# Patient Record
Sex: Female | Born: 1985 | Hispanic: Yes | Marital: Married | Smoking: Never smoker
Health system: Southern US, Community
[De-identification: ages and names within clinical notes are randomized; demographics above are authoritative.]

---

## 2014-12-07 ENCOUNTER — Emergency Department: Payer: Self-pay

## 2014-12-07 ENCOUNTER — Encounter: Payer: Self-pay | Admitting: Emergency Medicine

## 2014-12-07 ENCOUNTER — Emergency Department
Admission: EM | Admit: 2014-12-07 | Discharge: 2014-12-07 | Disposition: A | Discharge status: Home / Self Care | Payer: Self-pay | Attending: Emergency Medicine | Admitting: Emergency Medicine

## 2014-12-07 DIAGNOSIS — S53104A Unspecified dislocation of right ulnohumeral joint, initial encounter: Secondary | ICD-10-CM

## 2014-12-07 DIAGNOSIS — Y92000 Kitchen of unspecified non-institutional (private) residence as  the place of occurrence of the external cause: Secondary | ICD-10-CM | POA: Insufficient documentation

## 2014-12-07 DIAGNOSIS — Z9889 Other specified postprocedural states: Secondary | ICD-10-CM

## 2014-12-07 DIAGNOSIS — Y998 Other external cause status: Secondary | ICD-10-CM | POA: Insufficient documentation

## 2014-12-07 DIAGNOSIS — Z720 Tobacco use: Secondary | ICD-10-CM | POA: Insufficient documentation

## 2014-12-07 DIAGNOSIS — S53024A Posterior dislocation of right radial head, initial encounter: Secondary | ICD-10-CM | POA: Insufficient documentation

## 2014-12-07 DIAGNOSIS — W010XXA Fall on same level from slipping, tripping and stumbling without subsequent striking against object, initial encounter: Secondary | ICD-10-CM | POA: Insufficient documentation

## 2014-12-07 DIAGNOSIS — Q899 Congenital malformation, unspecified: Secondary | ICD-10-CM

## 2014-12-07 DIAGNOSIS — Y9389 Activity, other specified: Secondary | ICD-10-CM | POA: Insufficient documentation

## 2014-12-07 MED ORDER — HYDROMORPHONE HCL 1 MG/ML IJ SOLN
1.0000 mg | Freq: Once | INTRAMUSCULAR | Status: AC
  Administered 2014-12-07: 1 mg via INTRAVENOUS
  Filled 2014-12-07: qty 1

## 2014-12-07 MED ORDER — OXYCODONE-ACETAMINOPHEN 5-325 MG PO TABS: 1.0000 | ORAL_TABLET | Freq: Four times a day (QID) | ORAL | Status: AC | PRN

## 2014-12-07 NOTE — ED Provider Notes (Addendum)
[facility] Emergency Department Provider Note  ____________________________________________   I have reviewed the triage vital signs and the nursing notes.   HISTORY  Chief Complaint Joint Swelling    HPI Alexandria Green is a 29 y.o. female who is baseline healthy states she tripped on a wet floor in her kitchen and landed on her outstretched right arm. She felt immediate pain to her elbow. No pain anywhere else. No numbness or tingling. No closed head injury no back pain or back injury no neck injury, she denies headache or closed head injury or loss of consciousness. This is a nonstick fall. She has no hip pain. She's been ambulatory. Pain is isolated to that right elbow.  No past medical history on file.  There are no active problems to display for this patient.   No past surgical history on file.  No current outpatient prescriptions on file.  Allergies Review of patient's allergies indicates no known allergies.  No family history on file.  Social History Social History  Substance Use Topics  . Smoking status: Current Some Day Smoker    Types: Cigarettes  . Smokeless tobacco: Not on file  . Alcohol Use: No    Review of Systems Constitutional: No fever/chills Eyes: No visual changes. ENT: No sore throat. No stiff neck no neck pain Cardiovascular: Denies chest pain. Respiratory: Denies shortness of breath. Gastrointestinal:   no vomiting.  No diarrhea.  No constipation. Genitourinary: Negative for dysuria. Musculoskeletal: Negative lower extremity swelling Skin: Negative for rash. Neurological: Negative for headaches, focal weakness or numbness. 10-point ROS otherwise negative.  ____________________________________________   PHYSICAL EXAM:  VITAL SIGNS: ED Triage Vitals  Enc Vitals Group     BP 12/07/14 1729 130/84 mmHg     Pulse Rate 12/07/14 1729 82     Resp 12/07/14 1729 16     Temp 12/07/14 1729 97.5 F (36.4 C)      Temp Source 12/07/14 1729 Oral     SpO2 12/07/14 1729 98 %     Weight 12/07/14 1729 187 lb (84.823 kg)     Height 12/07/14 1729  (1.575 m)     Head Cir --      Peak Flow --      Pain Score 12/07/14 1730 9     Pain Loc --      Pain Edu? --      Excl. in GC? --     Constitutional: Alert and oriented. Well appearing and in no acute distress. Eyes: Conjunctivae are normal. PERRL. EOMI. Head: Atraumatic. Nose: No congestion/rhinnorhea. Mouth/Throat: Mucous membranes are moist.  Oropharynx non-erythematous. Neck: No stridor.   Nontender with no meningismus Cardiovascular: Normal rate, regular rhythm. Grossly normal heart sounds.  Good peripheral circulation. Respiratory: Normal respiratory effort.  No retractions. Lungs CTAB. Abdominal: Soft and nontender. No distention. No guarding no rebound Back:  There is no focal tenderness or step off there is no midline tenderness there are no lesions noted. there is no CVA tenderness Musculoskeletal: No lower extremity tenderness. No joint effusions, no DVT signs strong distal pulses no edema Right upper extremity: No elbow pain no wrist pain or tenderness, there is obvious deformity to the right elbow, with limited range of motion, strong distal pulses Arms or soft sensation is intact Strength is equal although somewhat limited by pain.  Neurologic:  Normal speech and language. No gross focal neurologic deficits are appreciated.  Skin:  Skin is warm, dry and intact. No rash noted.  Psychiatric: Mood and affect are normal. Speech and behavior are normal.  ____________________________________________   LABS (all labs ordered are listed, but only abnormal results are displayed)  Labs Reviewed - No data to display ____________________________________________  EKG  I personally interpreted any EKGs ordered by me or triage  ____________________________________________  RADIOLOGY  I reviewed any imaging ordered by me or triage that were  performed during my shift ____________________________________________   PROCEDURES  Procedure(s) performed: Elbow dislocation reduction, closed, patient did give verbal consent for the procedure. After menstruation of IV analgesia, patient was placed in an upright position, with the assistance of a assistant who applied pressure to the olecranon with his thumbs, I applied traction to the arm itself and turned slightly supine, the elbow relocated itself at that point. I was unable to range the elbow with no evidence of dislocation. She still had comfort there but it was diminished by the procedure. She tolerated it well with no complications. At the end of the procedure she was neurovascular intact with strong pulses and no obvious radial ulnar or medial nerve issues. Compartments remain soft and patient was pleased with the result.  Splint placement: I personally supervised the placement neurovascular intact afterwards  Critical Care performed: None  ____________________________________________   INITIAL IMPRESSION / ASSESSMENT AND PLAN / ED COURSE  Pertinent labs & imaging results that were available during my care of the patient were reviewed by me and considered in my medical decision making (see chart for details).  Patient with a posterior shoulder dislocation. I did offer her moderate sedation but she refused preferring to have pain medication and seeing if I could do it while she was awake. This is not unreasonable and certainly her choice. However, the alternative was offered to her. This being said, we did give her a milligram of IV Dilaudid which she tolerated well, and then we were able to easily reduce her arm with no couple occasions. See note above. Awaiting postreduction films. I placed her in a posterior splint.  ----------------------------------------- 7:45 PM on 12/07/2014 -----------------------------------------  Recent pain is very well controlled now that her elbow  is reduced. Stressed with on-call orthopedic surgery Dr. Rosita KeaMenz who agrees with management and will follow-up with patient in a few days. Neurovascular intact at discharge.    ____________________________________________   FINAL CLINICAL IMPRESSION(S) / ED DIAGNOSES  Final diagnoses:  Deformity  Status post reduction mammoplasty     Jeanmarie PlantJames A McShane, MD 12/07/14 1908  Jeanmarie PlantJames A McShane, MD 12/07/14 1946

## 2014-12-07 NOTE — Discharge Instructions (Signed)
Luxacin del codo  (Elbow Dislocation)  La luxacin del tobillo es el desplazamiento de los huesos que forman la articulacin del codo. El codo est formado por la unin de tres [place]. El hmero es el hueso de la parte superior del brazo. El radio y [place] cbito son los 2 huesos del antebrazo, que forman la parte inferior del codo. Los [place] del codo se Psychologist, occupational con un tejido fibroso y Health and safety inspector (ligamento) que conecta los [place] s. CAUSAS  Las luxaciones del codo no son frecuentes. Generalmente se producen cuando una persona cae hacia adelante con las manos y los codos extendidos. La fuerza del impacto se transmite al codo. A veces hay un movimiento de torsin. Tambin ocurren [employer] Partners accidentes automovilsticos, cuando los pasajeros se abrazan durante el impacto.  FACTORES DE RIESGO  Aunque la luxacin del codo puede ocurrirle a cualquier persona, algunas tienen ms riesgo que [place]. Las personas que tienen ms riesgo de sufrir una luxacin son:   Las personas que nacen con ms flojedad en los ligamentos.  Los que nacen con un cbito que tiene una muesca poco profunda en la bisagra de la articulacin. SNTOMAS  Los sntomas de una luxacin completa son evidentes. Incluyen dolor intenso y la deformidad del brazo.  Los sntomas de una luxacin parcial no son tan evidentes. El codo tiene algo de [place], Biomedical engineer puede doler y Theme park manager hinchado. Tambin, es probable que haya hematomas en la zona interna y externa del codo, en el lugar en que los ligamentos se han estirado o roto.  DIAGNSTICO  Para diagnosticar la luxacin, el mdico le har un examen fsico. Durante el examen, el mdico verificar si siente dolor, el brazo est hinchado y ver si hay deformidad. Tambin se controlarn la piel y la circulacin del brazo. Le tomar el pulso en la [place]. Si durante la luxacin se ha daado la arteria, la mano estar fra al tacto y podr estar de color blanco o prpura. El mdico tambin  controlar la capacidad de mover la [place] y los dedos, para ver si hubo lesiones en los nervios.  Le indicar una radiografa para determinar si hubo lesin en el hueso. Los resultados de la radiografa ayudarn a Scientist, clinical (histocompatibility and immunogenetics) la direccin de la luxacin.  Si tiene una luxacin simple, no habr lesiones importantes en el hueso. Si tiene una luxacin compleja, podr tener huesos rotos (fracturas) asociadas a las [employer].  TRATAMIENTO  Si se trata de una luxacin simple del codo, se realinearn los huesos en un procedimiento denominado reduccin. En [employer], los huesos se vuelven a su posicin original, aplicando un medicamento que adormecer la zona (anestesia regional) o le administrarn un medicamento que lo har dormir ( anestesia general ). Luego se inmoviliza el codo con un cabestrillo o tablilla durante 2 a 3 semanas. Luego debe continuar el tratamiento con fisioterapia para volver a Paramedic.  La luxacin compleja puede requerir ciruga para reestablecer la alineacin de la articulacin y Therapist, sports los ligamentos. Luego de la [place], el codo se proteger con un dispositivo llamado fijador externo. Este dispositivo evita que el codo se luxe nuevamente cuando se realizan los ejercicios de [place]. Podr ser Lois Huxley ciruga adicional para reparar las lesiones en los vasos sanguneos, los nervios, los huesos y ligamentos o para Paramedic la presin excesiva que provoca la hinchazn alrededor de los msculos.  INSTRUCCIONES PARA EL CUIDADO EN EL HOGAR  Las medidas siguientes ayudan a Careers information officer  dolor y facilitan el proceso de curacin.   Haga descansar la articulacin lesionada. No la mueva. Evite las actividades similares a la que le produjo la lesin.  Ejercite la mano y los dedos en la forma que le ha indicado el mdico.  Aplique hielo en la articulacin lesionada durante 1  2 das despus de la reduccin, o segn lo que le indique su mdico.  La aplicacin del hielo ayuda a reducir la inflamacin y Chief Technology Officer.  Ponga el hielo en una bolsa plstica.  Colquese una toalla entre la piel y la bolsa de hielo.  Deje el hielo durante 15 a 20 minutos, cada un par de horas mientras se encuentre despierto.  Eleve el brazo por arriba del nivel del corazn y [place] la [place] y los dedos segn las indicaciones del mdico para evitar la hinchazn.  Tome slo medicamentos de venta libre o prescriptos para Primary school teacher, segn las indicaciones del mdico. SOLICITE ATENCIN MDICA DE INMEDIATO SI:   El cabestrillo se daa.  Tiene un fijador externo y ste se afloja o no se mueve.  Tiene un fijador externo y nota que un lquido supura alrededor de los [place].  El Metallurgist de Scientist, clinical (histocompatibility and immunogenetics).  Pierde la sensibilidad en la mano o en los dedos. ASEGRESE DE QUE:   Comprende estas instrucciones.  Controlar su enfermedad.  Solicitar ayuda de inmediato si no mejora o si empeora.   Esta informacin no tiene Theme park manager el consejo del mdico. Asegrese de hacerle al mdico cualquier pregunta que tenga.   Document Released: 10/21/2004 Document Revised: 04/05/2011 Elsevier Interactive Patient Education 2016 [employer].  Cuidados del yeso o la frula (Cast or Splint Care) El yeso y las frulas sostienen los miembros lesionados y evitan que los huesos se muevan hasta que se curen.  CUIDADOS EN EL HOGAR  Mantenga el yeso o la frula al descubierto durante el tiempo de secado.  El yeso tarda entre 14 y 48 horas en secarse.  La fibra de vidrio se seca en menos de 1 hora.  No apoye el yeso sobre nada que sea ms duro que una almohada durante 24 horas.  No soporte ningn peso sobre el [employer]. No haga presin sobre el yeso. Espere a que el mdico lo autorice.  Mantenga el yeso o la frula secos.  Cbralos con una bolsa plstica cuando se d un bao o los [address]  [address] de [place].  Si tiene Corporate treasurer trax y  la cintura (el tronco) bese con una esponja hasta que se lo quiten.  Si el yeso se moja, squelo con una toalla o un secador de cabello. Utilice el aire fro del secador.  Mantenga el yeso o la frula limpios. Limpie el yeso sucio con un pao hmedo.  Noponga objetos extraos debajo del yeso o de la frula.  No se rasque la piel por debajo del molde con ningn objeto. Si siente picazn, use un secador de cabello con aire fro sobre la zona que pica.  No recorte ni perfore el yeso.  No retire el relleno acolchado que se encuentra debajo del yeso.  Ejercite como le ha indicado el mdico las articulaciones que se encuentran cerca del yeso.  Eleve (levante) el miembro lesionado sobre 1  2 almohadas durante los primeros 1 a 3 das. SOLICITE AYUDA SI:  El yeso o la frula se quiebran.  Siente que el yeso o la frula estn muy apretados o muy flojos.  Siente Neomia Dear  picazn intensa por debajo del yeso.  El yeso se moja o tiene una zona blanda.  Siente un feo olor que proviene del interior del [place].  AlgThrivent Financialn objeto se queda atascado bajo el yeso.  La piel que rodea el yeso enrojece o se vuelve sensible.  Le aparece dolor, o siente ms dolor luego de la colocacin del yeso. SOLICITE AYUDA DE INMEDIATO SI:  Observa un lquido que sale por el yeso.  No puede mover los dedos.  Los dedos estn de color azul o blanco, estn fros, le duelen y estn inflamados (hinchados).  Siente hormigueo o pierde la sensibilidad (adormecimiento) alrededor de la zona de la lesin.  Aumenta el dolor o la presin debajo del yeso.  Tiene problemas para respirar o Company secretaryle falta el aire.  Siente dolor en el pecho.   Esta informacin no tiene Theme park managercomo fin reemplazar el consejo del mdico. Asegrese de hacerle al mdico cualquier pregunta que tenga.   Document Released: 06/15/2010 Document Revised: 09/13/2012 Elsevier Interactive Patient Education [employer]2016 Elsevier Inc.

## 2014-12-07 NOTE — ED Notes (Signed)
Fell backs, caught herself with her rt elbow and is now having pain with deformity

## 2016-09-07 IMAGING — CR DG ELBOW 2V*R*
1 series · 2 of 2 positions shown · non-contrast
Comparison: Right elbow radiographs performed earlier today at [DATE]
p.m.

CLINICAL DATA: Status post reduction of right elbow dislocation.
Initial encounter.

EXAM:
RIGHT ELBOW - 2 VIEW

[Series 1: ap · 0.17mm/px · 2 of 2 slices shown]
[im 1/2]
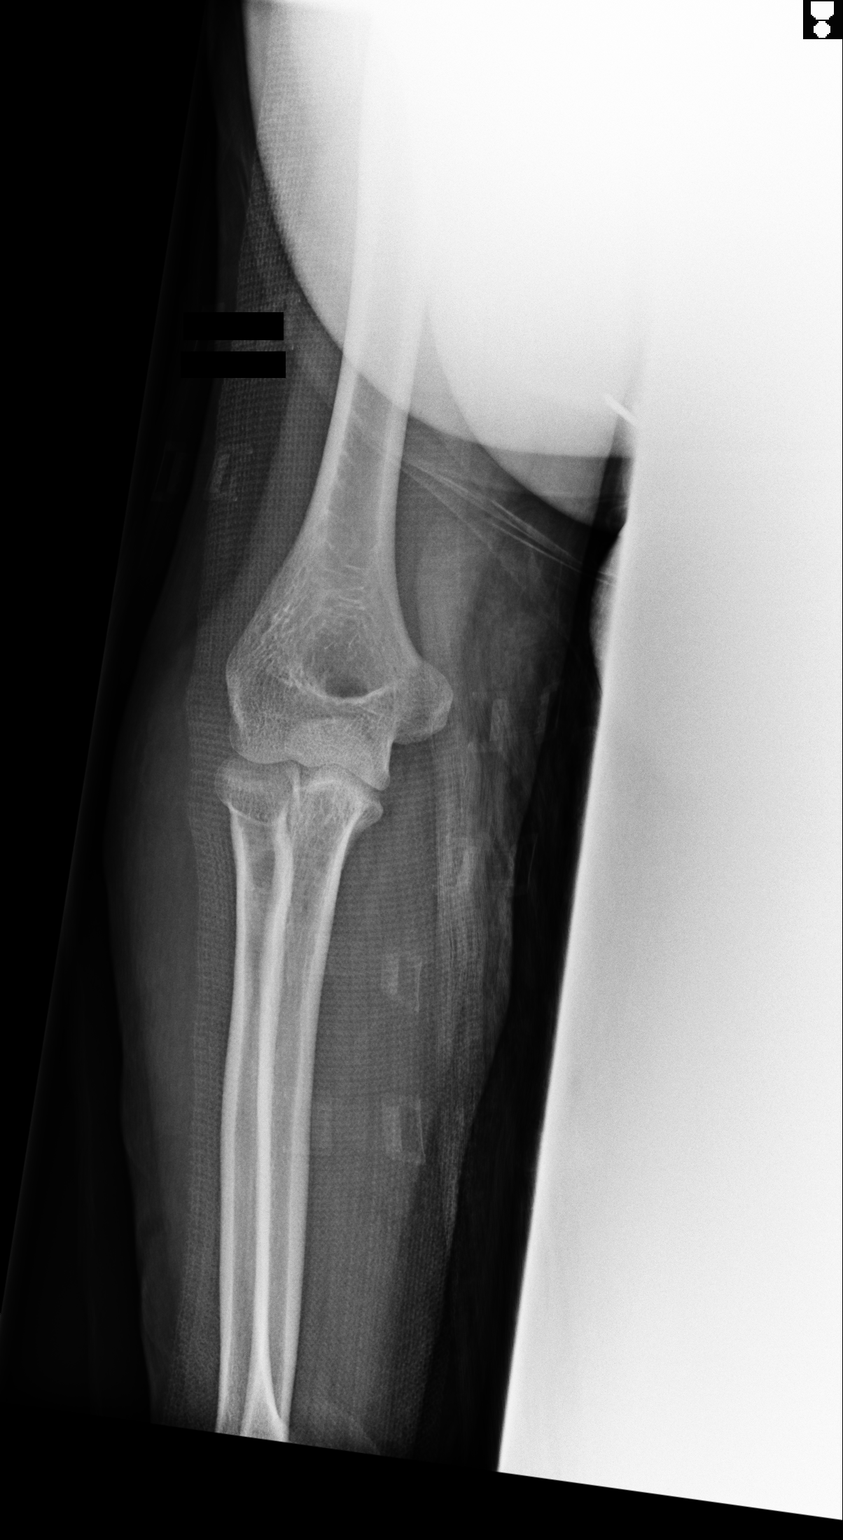
[im 2/2]
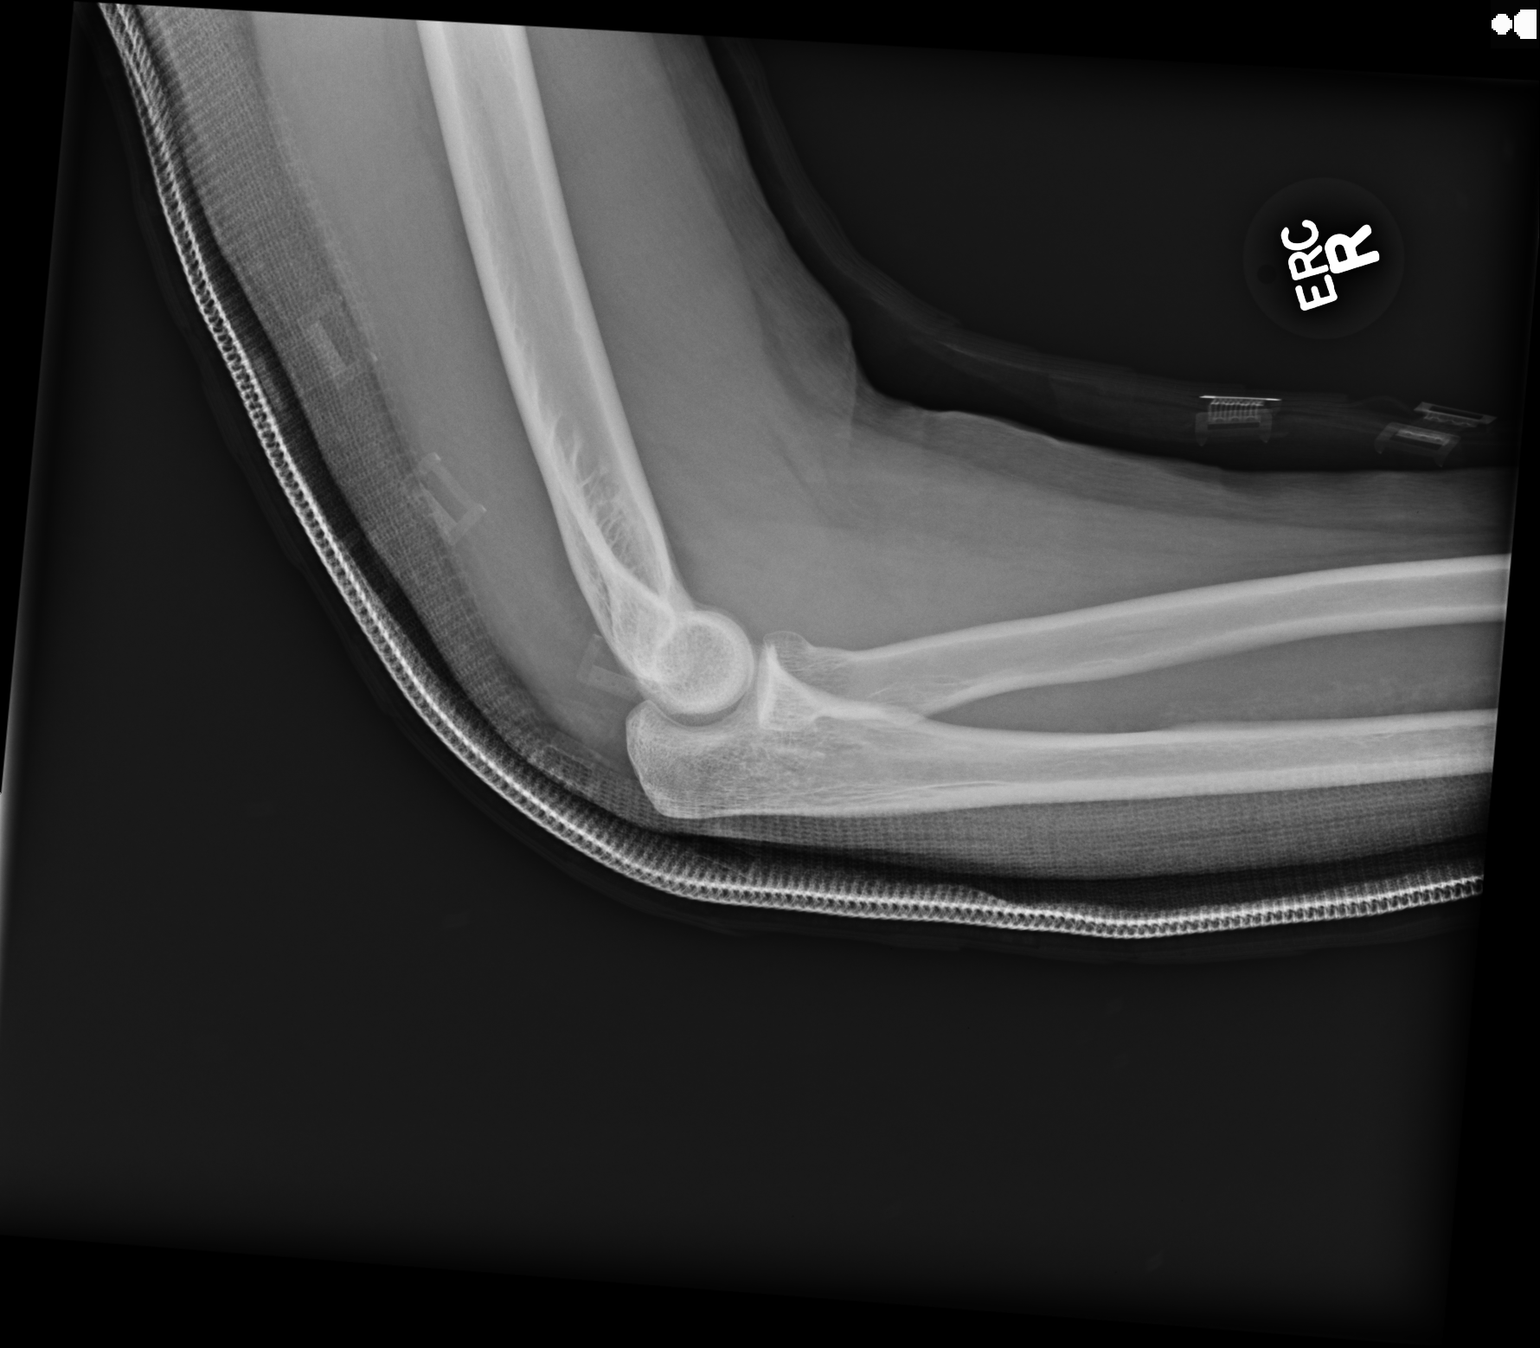

[2 of 2 positions shown; findings below may reference images not displayed]

FINDINGS: There has been successful reduction of the right elbow dislocation.
No definite fracture is seen. An associated elbow joint effusion is
noted. The soft tissues are otherwise grossly unremarkable.
IMPRESSION: Successful reduction of right elbow dislocation. No definite
fracture seen. Elbow joint effusion noted.

## 2020-11-05 ENCOUNTER — Encounter: Payer: Self-pay | Admitting: Physician Assistant

## 2020-11-05 ENCOUNTER — Ambulatory Visit (LOCAL_COMMUNITY_HEALTH_CENTER): Payer: Self-pay | Admitting: Physician Assistant

## 2020-11-05 ENCOUNTER — Other Ambulatory Visit: Payer: Self-pay

## 2020-11-05 VITALS — BP 132/60 | Ht 60.39 in | Wt 211.2 lb

## 2020-11-05 DIAGNOSIS — Z3009 Encounter for other general counseling and advice on contraception: Secondary | ICD-10-CM

## 2020-11-05 DIAGNOSIS — Z0000 Encounter for general adult medical examination without abnormal findings: Secondary | ICD-10-CM

## 2020-11-05 DIAGNOSIS — Z7189 Other specified counseling: Secondary | ICD-10-CM

## 2020-11-05 DIAGNOSIS — Z30017 Encounter for initial prescription of implantable subdermal contraceptive: Secondary | ICD-10-CM

## 2020-11-05 MED ORDER — ETONOGESTREL 68 MG ~~LOC~~ IMPL
68.0000 mg | DRUG_IMPLANT | Freq: Once | SUBCUTANEOUS | Status: AC
  Administered 2020-11-05: 68 mg via SUBCUTANEOUS

## 2020-11-05 NOTE — Progress Notes (Deleted)
Patient here for an intial PE. Patient instructed to come back on 02/11/20 for blood work. Condoms declined. Records were obtained from [facility].

## 2020-11-05 NOTE — Progress Notes (Signed)
See office visit note for documentation of insertion.

## 2020-11-05 NOTE — Progress Notes (Signed)
Patient here for nexplanon insertion and PE.[facility] records received and given to provider.

## 2020-11-07 ENCOUNTER — Encounter: Payer: Self-pay | Admitting: Physician Assistant

## 2020-11-07 NOTE — Progress Notes (Signed)
[facility] DEPARTMENT [facility] [address] Main Number: [phone]    Family Planning Visit- Initial Visit  Subjective:  Alexandria Green is a 35 y.o.  G2P2   being seen today for an initial annual visit and to discuss contraceptive options.  The patient is currently using Abstinence for pregnancy prevention. Patient reports she does not want a pregnancy in the next year.  Patient has the following medical conditions does not have a problem list on file.  Chief Complaint  Patient presents with   Contraception    Patient reports that she is here for a Nexplanon insertion.  States had her PE with her PCP earlier this year and is followed there for primary care concerns.  States that she has had a Nexplanon in the past without problems.  ROI will be sent for copy of last PE and pap to confirm dates.    Patient denies any concerns today.   Body mass index is 40.72 kg/m. - Patient is eligible for diabetes screening based on BMI and age >37?  not applicable HA1C ordered? not applicable  Patient reports 2  partner/s in last year. Desires STI screening?  No - patient declines  Has patient been screened once for HCV in the past?  No  No results found for: HCVAB  Does the patient have current drug use (including MJ), have a partner with drug use, and/or has been incarcerated since last result? No  If yes-- Screen for HCV through [facility]   Does the patient meet criteria for HBV testing? No  Criteria:  -Household, sexual or needle sharing contact with HBV -History of drug use -HIV positive -Those with known Hep C   Health Maintenance Due  Topic Date Due   COVID-19 Vaccine (1) Never done   HIV Screening  Never done   Hepatitis C Screening  Never done   TETANUS/TDAP  Never done   INFLUENZA VACCINE  Never done    Review of Systems  All other systems reviewed and are negative.  The following portions of the patient's history  were reviewed and updated as appropriate: allergies, current medications, past family history, past medical history, past social history, past surgical history and problem list. Problem list updated.   See flowsheet for other program required questions.  Objective:   Vitals:   11/05/20 0902  BP: 132/60  Weight: 211 lb 3.2 oz (95.8 kg)  Height: 5' 0.39" (1.534 m)    Physical Exam Vitals and nursing note reviewed.  Constitutional:      General: She is not in acute distress.    Appearance: Normal appearance.  HENT:     Head: Normocephalic and atraumatic.     Mouth/Throat:     Mouth: Mucous membranes are moist.     Pharynx: Oropharynx is clear. No oropharyngeal exudate or posterior oropharyngeal erythema.  Eyes:     Conjunctiva/sclera: Conjunctivae normal.  Neck:     Thyroid: No thyroid mass, thyromegaly or thyroid tenderness.  Cardiovascular:     Rate and Rhythm: Normal rate and regular rhythm.  Pulmonary:     Effort: Pulmonary effort is normal.     Breath sounds: Normal breath sounds.  Abdominal:     Palpations: Abdomen is soft.  Musculoskeletal:     Cervical back: Neck supple. No tenderness.  Lymphadenopathy:     Cervical: No cervical adenopathy.  Skin:    General: Skin is warm and dry.  Neurological:  Mental Status: She is alert and oriented to person, place, and time.  Psychiatric:        Mood and Affect: Mood normal.        Behavior: Behavior normal.        Thought Content: Thought content normal.        Judgment: Judgment normal.      Assessment and Plan:  Alexandria Green is a 35 y.o. female presenting to the [facility] Department for an initial annual wellness/contraceptive visit  Contraception counseling: Reviewed all forms of birth control options in the tiered based approach. available including abstinence; over the counter/barrier methods; hormonal contraceptive medication including pill, patch, ring, injection,contraceptive implant,  ECP; hormonal and nonhormonal IUDs; permanent sterilization options including vasectomy and the various tubal sterilization modalities. Risks, benefits, and typical effectiveness rates were reviewed.  Questions were answered.  Written information was also given to the patient to review.  Patient desires Nexplanon insertion, this was prescribed for patient. She will follow up in  1year and prn for surveillance.  She was told to call with any further questions, or with any concerns about this method of contraception.  Emphasized use of condoms 100% of the time for STI prevention.  Patient was not a candidate for ECP today.   1. Encounter for counseling regarding contraception Reviewed with patient as above re: BCM options. Reviewed with patient normal SE of Nexplanon and when to call clinic with concerns. Enc condoms with all sex for STD protection.   2. Well woman exam (no gynecological exam) Reviewed with patient healthy habits to maintain general health. Enc MVI 1 po daily. Reviewed records received from PCP; last PE was 07/2019 with pap that was NILM/ HPV neg. Enc to establish with/ follow up with PCP for primary care concerns, age appropriate screenings and illness.   3. Nexplanon insertion Nexplanon Insertion Procedure Patient identified, informed consent performed, consent signed.   Patient does understand that irregular bleeding is a very common side effect of this medication. She was advised to have backup contraception after placement. Patient was determined to meet WHO criteria for not being pregnant. Appropriate time out taken.  The insertion site was identified 8-10 cm (3-4 inches) from the medial epicondyle of the humerus and 3-5 cm (1.25-2 inches) posterior to (below) the sulcus (groove) between the biceps and triceps muscles of the patient's left arm and marked.  Patient was prepped with alcohol swab and then injected with 3 ml of 1% lidocaine.  Arm was prepped with chlorhexidene,  Nexplanon removed from packaging,  Device confirmed in needle, then inserted full length of needle and withdrawn per handbook instructions. Nexplanon was able to palpated in the patient's arm; patient palpated the insert herself. There was minimal blood loss.  Patient insertion site covered with guaze and a pressure bandage to reduce any bruising.  The patient tolerated the procedure well and was given post procedure instructions.    Counseled patient to take OTC analgesic starting as soon as lidocaine starts to wear off and take regularly for at least 48 hr to decrease discomfort.  Specifically to take with food or milk to decrease stomach upset and for IB 600 mg (3 tablets) every 6 hrs; IB 800 mg (4 tablets) every 8 hrs; or Aleve 2 tablets every 12 hrs.   - etonogestrel (NEXPLANON) implant 68 mg     Return in about 1 year (around 11/05/2021) for RP and prn.  No future appointments.  Matt Holmes, PA

## 2022-02-10 ENCOUNTER — Emergency Department
Admission: EM | Admit: 2022-02-10 | Discharge: 2022-02-10 | Disposition: A | Discharge status: Home / Self Care | Payer: Self-pay | Attending: Emergency Medicine | Admitting: Emergency Medicine

## 2022-02-10 ENCOUNTER — Other Ambulatory Visit: Payer: Self-pay

## 2022-02-10 ENCOUNTER — Emergency Department: Payer: Self-pay

## 2022-02-10 DIAGNOSIS — K5900 Constipation, unspecified: Secondary | ICD-10-CM

## 2022-02-10 DIAGNOSIS — R1031 Right lower quadrant pain: Secondary | ICD-10-CM

## 2022-02-10 DIAGNOSIS — R932 Abnormal findings on diagnostic imaging of liver and biliary tract: Secondary | ICD-10-CM

## 2022-02-10 LAB — URINALYSIS, ROUTINE W REFLEX MICROSCOPIC
Bilirubin Urine: NEGATIVE
Glucose, UA: NEGATIVE mg/dL
Ketones, ur: NEGATIVE mg/dL
Nitrite: NEGATIVE
Protein, ur: NEGATIVE mg/dL
Specific Gravity, Urine: 1.023 (ref 1.005–1.030)
pH: 6 (ref 5.0–8.0)

## 2022-02-10 LAB — POC URINE PREG, ED: Preg Test, Ur: NEGATIVE

## 2022-02-10 LAB — COMPREHENSIVE METABOLIC PANEL
ALT: 32 U/L (ref 0–44)
AST: 34 U/L (ref 15–41)
Albumin: 4.2 g/dL (ref 3.5–5.0)
Alkaline Phosphatase: 58 U/L (ref 38–126)
Anion gap: 9 (ref 5–15)
BUN: 15 mg/dL (ref 6–20)
CO2: 23 mmol/L (ref 22–32)
Calcium: 9.3 mg/dL (ref 8.9–10.3)
Chloride: 104 mmol/L (ref 98–111)
Creatinine, Ser: 0.7 mg/dL (ref 0.44–1.00)
GFR, Estimated: >60 mL/min (ref >60)
Glucose, Bld: 113 mg/dL — ABNORMAL HIGH (ref 70–99)
Potassium: 4.4 mmol/L (ref 3.5–5.1)
Sodium: 136 mmol/L (ref 135–145)
Total Bilirubin: 0.7 mg/dL (ref 0.3–1.2)
Total Protein: 7.9 g/dL (ref 6.5–8.1)

## 2022-02-10 LAB — CBC
HCT: 45.1 % (ref 36.0–46.0)
Hemoglobin: 14.8 g/dL (ref 12.0–15.0)
MCH: 31.4 pg (ref 26.0–34.0)
MCHC: 32.8 g/dL (ref 30.0–36.0)
MCV: 95.6 fL (ref 80.0–100.0)
Platelets: 338 10*3/uL (ref 150–400)
RBC: 4.72 MIL/uL (ref 3.87–5.11)
RDW: 11.9 % (ref 11.5–15.5)
WBC: 7.4 10*3/uL (ref 4.0–10.5)
nRBC: 0 % (ref 0.0–0.2)

## 2022-02-10 LAB — LIPASE, BLOOD: Lipase: 43 U/L (ref 11–51)

## 2022-02-10 MED ORDER — IOHEXOL 300 MG/ML  SOLN
100.0000 mL | Freq: Once | INTRAMUSCULAR | Status: AC | PRN
  Administered 2022-02-10: 100 mL via INTRAVENOUS

## 2022-02-10 MED ORDER — ONDANSETRON HCL 4 MG/2ML IJ SOLN
4.0000 mg | Freq: Once | INTRAMUSCULAR | Status: AC
  Administered 2022-02-10: 4 mg via INTRAVENOUS
  Filled 2022-02-10: qty 2

## 2022-02-10 MED ORDER — MORPHINE SULFATE (PF) 4 MG/ML IV SOLN
4.0000 mg | Freq: Once | INTRAVENOUS | Status: AC
  Administered 2022-02-10: 4 mg via INTRAVENOUS
  Filled 2022-02-10: qty 1

## 2022-02-10 MED ORDER — LACTATED RINGERS IV BOLUS
1000.0000 mL | Freq: Once | INTRAVENOUS | Status: AC
  Administered 2022-02-10: 1000 mL via INTRAVENOUS

## 2022-02-10 NOTE — ED Triage Notes (Addendum)
Pt to ED via POV from home. Pt ambulatory to triage. Pt reports right sided abdominal pain since yesterday morning. Pt states burning with urination. Pt denies N/V/D. Pt reports no medical hx.

## 2022-02-10 NOTE — ED Notes (Signed)
Pt to ED for RLQ pain since yesterday morning. Had sm amt diarrhea last night. Denies urinary symptoms. Complains that pain is constant, 9/10. Feels nauseous.

## 2022-02-10 NOTE — ED Provider Notes (Signed)
[facility] Provider Note    Event Date/Time   First MD Initiated Contact with Patient 02/10/22 0845     (approximate)   History   Chief Complaint Abdominal Pain   HPI  Damonique Brunelle is a 37 y.o. female with no significant past medical history who presents to the ED complaining of abdominal pain.  Patient reports that she has been dealing with constant sharp pain in the right lower quadrant of her abdomen since yesterday morning.  She describes the pain as gradually worsening and has now become severe, does not seem to be exacerbated or alleviated by anything in particular.  She denies any associated fevers, has felt nauseous but has not vomited.  She does report 2 episodes of diarrhea, but has not noticed any blood in her stool.  She denies any dysuria, hematuria, or flank pain.  She has never had similar symptoms in the past and denies any abdominal surgical history.     Physical Exam   Triage Vital Signs: ED Triage Vitals  Enc Vitals Group     BP 02/10/22 0837 (!) 140/80     Pulse Rate 02/10/22 0837 73     Resp 02/10/22 0837 18     Temp 02/10/22 0837 98.1 F (36.7 C)     Temp Source 02/10/22 0837 Oral     SpO2 --      Weight --      Height --      Head Circumference --      Peak Flow --      Pain Score 02/10/22 0836 9     Pain Loc --      Pain Edu? --      Excl. in [place]? --     Most recent vital signs: Vitals:   02/10/22 0837  BP: (!) 140/80  Pulse: 73  Resp: 18  Temp: 98.1 F (36.7 C)    Constitutional: Alert and oriented. Eyes: Conjunctivae are normal. Head: Atraumatic. Nose: No congestion/rhinnorhea. Mouth/Throat: Mucous membranes are moist.  Cardiovascular: Normal rate, regular rhythm. Grossly normal heart sounds.  2+ radial pulses bilaterally. Respiratory: Normal respiratory effort.  No retractions. Lungs CTAB. Gastrointestinal: Soft and tender to palpation in the right lower quadrant with no rebound or guarding. No  distention. Musculoskeletal: No lower extremity tenderness nor edema.  Neurologic:  Normal speech and language. No gross focal neurologic deficits are appreciated.    ED Results / Procedures / Treatments   Labs (all labs ordered are listed, but only abnormal results are displayed) Labs Reviewed  COMPREHENSIVE METABOLIC PANEL - Abnormal; Notable for the following components:      Result Value   Glucose, Bld 113 (*)    All other components within normal limits  URINALYSIS, ROUTINE W REFLEX MICROSCOPIC - Abnormal; Notable for the following components:   Color, Urine YELLOW (*)    APPearance HAZY (*)    Hgb urine dipstick SMALL (*)    Leukocytes,Ua SMALL (*)    Bacteria, UA RARE (*)    All other components within normal limits  LIPASE, BLOOD  CBC  POC URINE PREG, ED   RADIOLOGY CT abdomen/pelvis reviewed and interpreted by me with no inflammatory changes, dilated bowel loops, or focal fluid collections.  PROCEDURES:  Critical Care performed: No  Procedures   MEDICATIONS ORDERED IN ED: Medications  morphine (PF) 4 MG/ML injection 4 mg (4 mg Intravenous Given 02/10/22 0948)  ondansetron (ZOFRAN) injection 4 mg (4 mg Intravenous Given 02/10/22 0947)  lactated ringers bolus 1,000 mL (1,000 mLs Intravenous New Bag/Given 02/10/22 0946)  iohexol (OMNIPAQUE) 300 MG/ML solution 100 mL (100 mLs Intravenous Contrast Given 02/10/22 1004)     IMPRESSION / MDM / ASSESSMENT AND PLAN / ED COURSE  I reviewed the triage vital signs and the nursing notes.                              37 y.o. female with no significant past medical history presents to the ED complaining of constant pain in the right lower quadrant of her abdomen associated with nausea and diarrhea since yesterday morning.  Patient's presentation is most consistent with acute presentation with potential threat to life or bodily function.  Differential diagnosis includes, but is not limited to, appendicitis, kidney stone,  biliary colic, cholecystitis, UTI, gastroenteritis.  Patient nontoxic-appearing and in no acute distress, vital signs are unremarkable.  She does have focal tenderness to palpation in the right lower quadrant of her abdomen and we will further assess with CT imaging to rule out appendicitis.  Pregnancy testing is negative and urinalysis shows no signs of infection.  Labs thus far are reassuring with no significant anemia, leukocytosis, electrolyte abnormality, or AKI.  LFTs and lipase are unremarkable, low suspicion for biliary pathology.  Plan to treat symptomatically with IV morphine and Zofran, hydrate with IV fluids.  CT imaging shows normal appendix, does show significant constipation which could be contributing to patient's pain.  Additionally, patient with incidental nonspecific liver findings, which she was informed of and counseled to follow-up with her PCP for MRI of.  She was counseled on over-the-counter management of constipation and counseled to return to the ED for new or worsening symptoms, patient agrees with plan.      FINAL CLINICAL IMPRESSION(S) / ED DIAGNOSES   Final diagnoses:  Right lower quadrant abdominal pain  Constipation, unspecified constipation type  Abnormal CT of liver     Rx / DC Orders   ED Discharge Orders     None        Note:  This document was prepared using Dragon voice recognition software and may include unintentional dictation errors.   Blake Divine, MD 02/10/22 1116

## 2022-02-10 NOTE — ED Notes (Signed)
Dr Charna Archer at bedside with interpreter to assess pt.

## 2022-02-10 NOTE — Discharge Instructions (Signed)

## 2022-06-15 ENCOUNTER — Other Ambulatory Visit: Payer: Self-pay | Admitting: Family Medicine

## 2022-06-15 DIAGNOSIS — K7689 Other specified diseases of liver: Secondary | ICD-10-CM

## 2022-06-23 ENCOUNTER — Other Ambulatory Visit: Payer: Self-pay

## 2022-06-23 ENCOUNTER — Ambulatory Visit
Admission: RE | Admit: 2022-06-23 | Discharge: 2022-06-23 | Disposition: A | Discharge status: Home / Self Care | Payer: Self-pay | Source: Ambulatory Visit | Attending: Family Medicine | Admitting: Family Medicine

## 2022-06-23 ENCOUNTER — Emergency Department
Admission: EM | Admit: 2022-06-23 | Discharge: 2022-06-23 | Disposition: A | Discharge status: Home / Self Care | Payer: Self-pay | Attending: Student in an Organized Health Care Education/Training Program | Admitting: Student in an Organized Health Care Education/Training Program

## 2022-06-23 ENCOUNTER — Emergency Department: Payer: Self-pay

## 2022-06-23 DIAGNOSIS — R042 Hemoptysis: Secondary | ICD-10-CM | POA: Insufficient documentation

## 2022-06-23 DIAGNOSIS — K7689 Other specified diseases of liver: Secondary | ICD-10-CM | POA: Insufficient documentation

## 2022-06-23 DIAGNOSIS — J020 Streptococcal pharyngitis: Secondary | ICD-10-CM | POA: Insufficient documentation

## 2022-06-23 LAB — GROUP A STREP BY PCR: Group A Strep by PCR: DETECTED — AB

## 2022-06-23 MED ORDER — GADOBUTROL 1 MMOL/ML IV SOLN
7.5000 mL | Freq: Once | INTRAVENOUS | Status: AC | PRN
  Administered 2022-06-23: 7.5 mL via INTRAVENOUS

## 2022-06-23 MED ORDER — ACETAMINOPHEN 325 MG PO TABS
650.0000 mg | ORAL_TABLET | Freq: Once | ORAL | Status: AC
  Administered 2022-06-23: 650 mg via ORAL
  Filled 2022-06-23: qty 2

## 2022-06-23 MED ORDER — AMOXICILLIN 500 MG PO CAPS: 500.0000 mg | ORAL_CAPSULE | Freq: Three times a day (TID) | ORAL | 0 refills | Status: AC

## 2022-06-23 MED ORDER — AMOXICILLIN 500 MG PO CAPS
500.0000 mg | ORAL_CAPSULE | Freq: Once | ORAL | Status: AC
  Administered 2022-06-23: 500 mg via ORAL
  Filled 2022-06-23: qty 1

## 2022-06-23 MED ORDER — DEXAMETHASONE 10 MG/ML FOR PEDIATRIC ORAL USE
10.0000 mg | Freq: Once | INTRAMUSCULAR | Status: AC
  Administered 2022-06-23: 10 mg via ORAL
  Filled 2022-06-23: qty 1

## 2022-06-23 NOTE — ED Triage Notes (Signed)
Information obtained by assist of spanish interpreter [phone]. Pt states has had three days of cough and throat pain with difficulty swallowing. Per pt has had hemoptysis. Pt states has had fever.

## 2022-06-23 NOTE — ED Notes (Signed)
Pt verbalizes understanding of discharge instructions. Opportunity for questioning and answers were provided. Pt discharged from ED to home with daughter.    

## 2022-06-23 NOTE — ED Provider Notes (Signed)
[facility] Provider Note    Event Date/Time   First MD Initiated Contact with Patient 06/23/22 1123     (approximate)   History   Hemoptysis   HPI  Alexandria Green is a 37 y.o. female who presents to the ER for evaluation of 3 days of worsening sore throat pain with swallowing.  States that she did have some coughing this morning with phlegm that had some streak of blood in it.  No change in phonation she is able to eat and drink but has some pain with swallowing.     Physical Exam   Triage Vital Signs: ED Triage Vitals  Enc Vitals Group     BP 06/23/22 1028 (!) 142/106     Pulse Rate 06/23/22 1028 83     Resp 06/23/22 1028 16     Temp 06/23/22 1028 98.8 F (37.1 C)     Temp Source 06/23/22 1028 Oral     SpO2 06/23/22 1028 100 %     Weight 06/23/22 1029 200 lb (90.7 kg)     Height --      Head Circumference --      Peak Flow --      Pain Score 06/23/22 1029 10     Pain Loc --      Pain Edu? --      Excl. in [place]? --     Most recent vital signs: Vitals:   06/23/22 1028  BP: (!) 142/106  Pulse: 83  Resp: 16  Temp: 98.8 F (37.1 C)  SpO2: 100%     Constitutional: Alert  Eyes: Conjunctivae are normal.  Head: Atraumatic. Nose: No congestion/rhinnorhea. Mouth/Throat: Mucous membranes are moist.  Tonsillar erythema with trace exudates on the right.  Uvula is midline. Neck: Painless ROM.  Cardiovascular:   Good peripheral circulation. Respiratory: Normal respiratory effort.  No retractions.  Gastrointestinal: Soft and nontender.  Musculoskeletal:  no deformity Neurologic:  MAE spontaneously. No gross focal neurologic deficits are appreciated.  Skin:  Skin is warm, dry and intact. No rash noted. Psychiatric: Mood and affect are normal. Speech and behavior are normal.    ED Results / Procedures / Treatments   Labs (all labs ordered are listed, but only abnormal results are displayed) Labs Reviewed  GROUP A STREP BY PCR -  Abnormal; Notable for the following components:      Result Value   Group A Strep by PCR DETECTED (*)    All other components within normal limits     EKG     RADIOLOGY Please see ED Course for my review and interpretation.  I personally reviewed all radiographic images ordered to evaluate for the above acute complaints and reviewed radiology reports and findings.  These findings were personally discussed with the patient.  Please see medical record for radiology report.    PROCEDURES:  Critical Care performed: No  Procedures   MEDICATIONS ORDERED IN ED: Medications  dexamethasone (DECADRON) 10 MG/ML injection for Pediatric ORAL use 10 mg (10 mg Oral Given 06/23/22 1155)  amoxicillin (AMOXIL) capsule 500 mg (500 mg Oral Given 06/23/22 1155)  acetaminophen (TYLENOL) tablet 650 mg (650 mg Oral Given 06/23/22 1155)     IMPRESSION / MDM / ASSESSMENT AND PLAN / ED COURSE  I reviewed the triage vital signs and the nursing notes.  Differential diagnosis includes, but is not limited to, pharyngitis, URI, Ludwig's angina, PTA, RPA, pneumonia  Presented to the ER for evaluation of symptoms consistent with strep pharyngitis.  Chest x-ray on my review and interpretation without evidence of pneumothorax.  Will be given antibiotics as well as Decadron.  Patient tolerating p.o.  Do not feel that further diagnostic testing clinically indicated.  Patient appropriate for outpatient follow-up.       FINAL CLINICAL IMPRESSION(S) / ED DIAGNOSES   Final diagnoses:  Pharyngitis due to Streptococcus species     Rx / DC Orders   ED Discharge Orders          Ordered    amoxicillin (AMOXIL) 500 MG capsule  3 times daily        06/23/22 1223             Note:  This document was prepared using Dragon voice recognition software and may include unintentional dictation errors.    Willy Eddy, MD 06/23/22 1224

## 2022-11-01 ENCOUNTER — Other Ambulatory Visit: Payer: Self-pay | Admitting: Family Medicine

## 2022-11-01 DIAGNOSIS — K7689 Other specified diseases of liver: Secondary | ICD-10-CM

## 2022-12-24 ENCOUNTER — Ambulatory Visit: Admission: RE | Admit: 2022-12-24 | Payer: Self-pay | Source: Ambulatory Visit

## 2023-10-31 ENCOUNTER — Encounter: Payer: Self-pay | Admitting: *Deleted
